# Patient Record
Sex: Female | Born: 1995 | Race: White | Hispanic: No | Marital: Single | Smoking: Never smoker
Health system: Southern US, Community
[De-identification: ages and names within clinical notes are randomized; demographics above are authoritative.]

## PROBLEM LIST (undated history)

## (undated) DIAGNOSIS — A749 Chlamydial infection, unspecified: Secondary | ICD-10-CM

## (undated) HISTORY — DX: Chlamydial infection, unspecified: A74.9

## (undated) HISTORY — PX: NO PAST SURGERIES: SHX2092

---
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
## 2018-02-22 IMAGING — US US ABDOMEN COMPLETE
1 series · 13 of 25 positions shown · non-contrast
Comparison: Ob ultrasound 05/01/2016

CLINICAL DATA: 20-year-old female with fever is eleven days
postpartum.

EXAM:
ABDOMEN ULTRASOUND COMPLETE

[Series 1: us abdomen complete · 0.27mm/px · 13 of 104 slices shown]
[im 1/104]
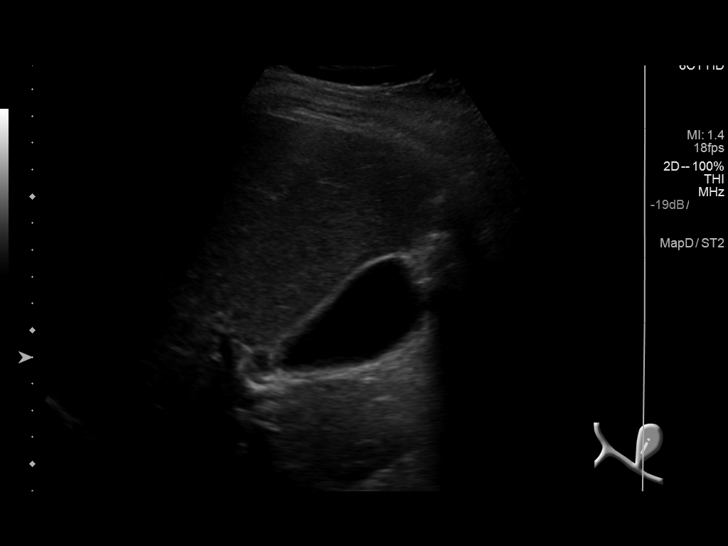
[im 9/104]
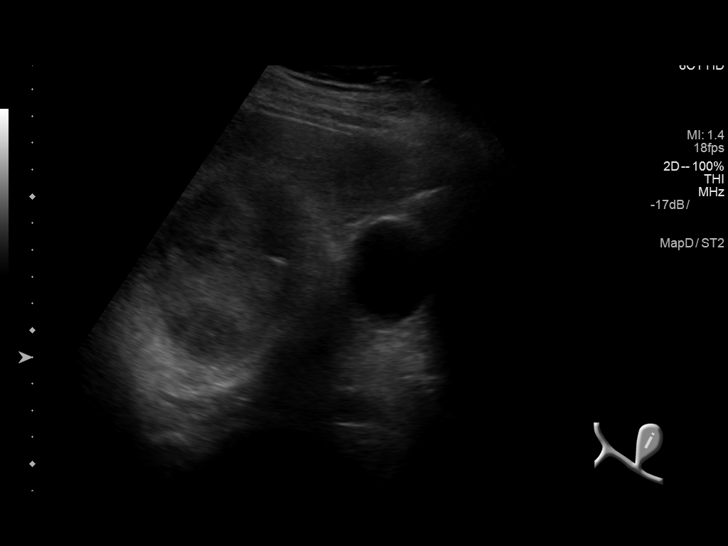
[im 18/104]
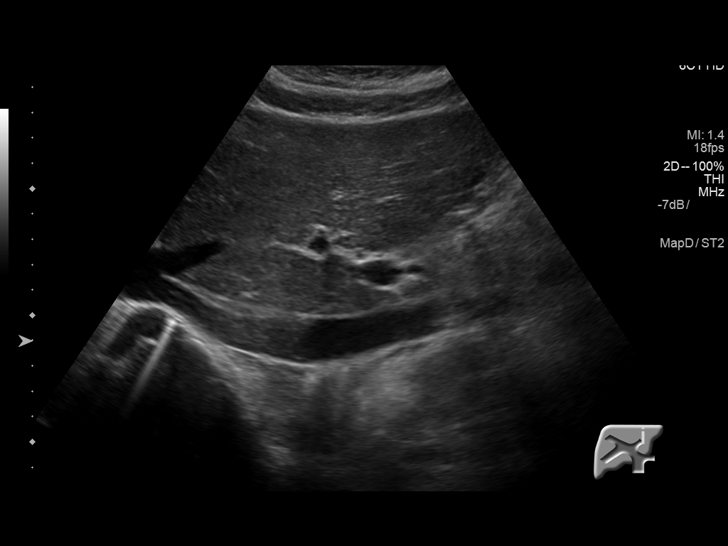
[im 26/104]
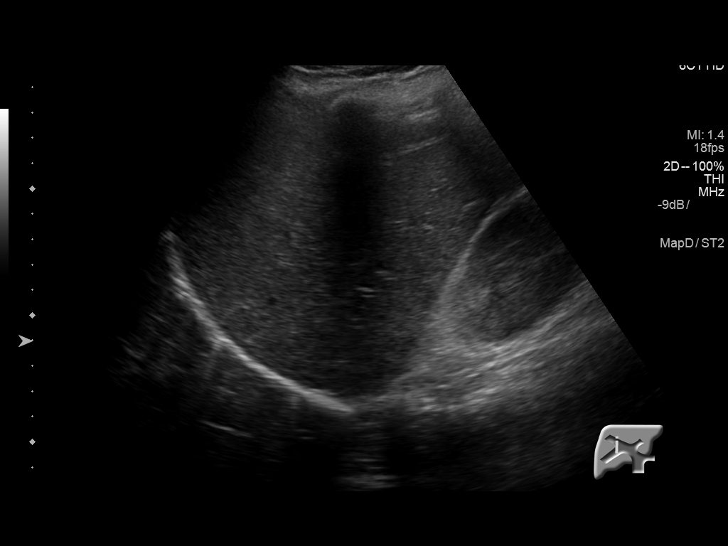
[im 35/104]
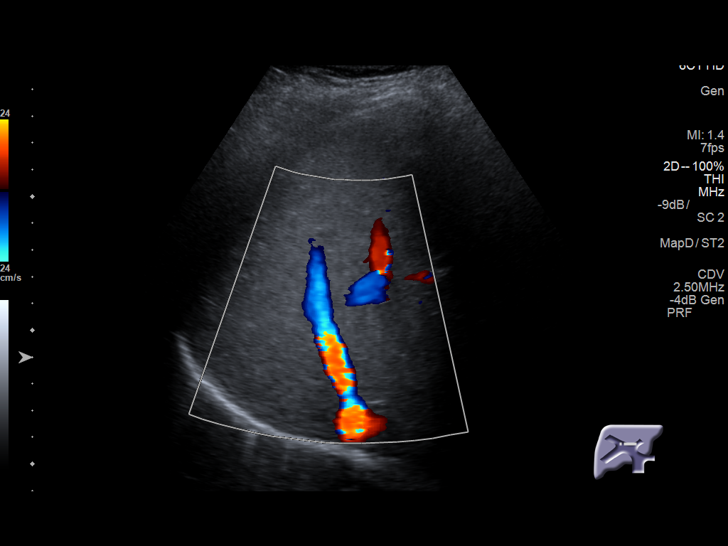
[im 43/104]
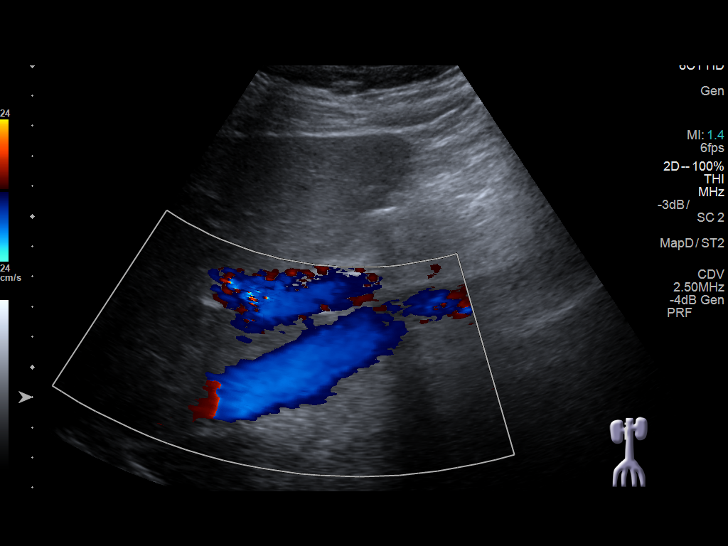
[im 52/104]
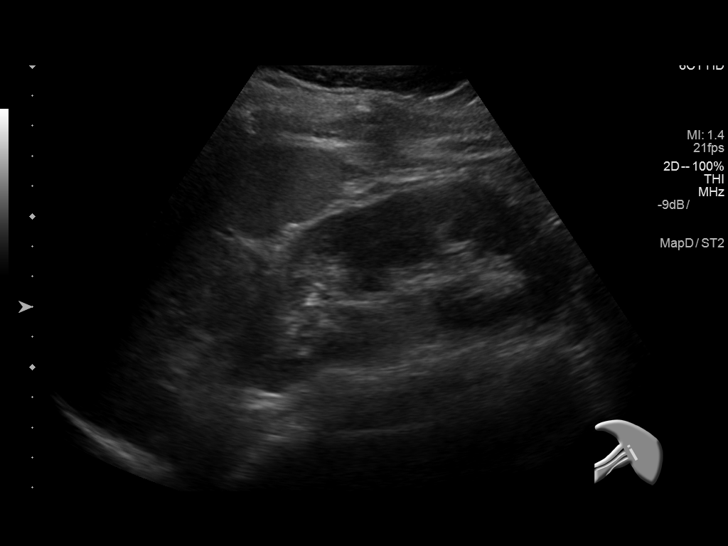
[im 61/104]
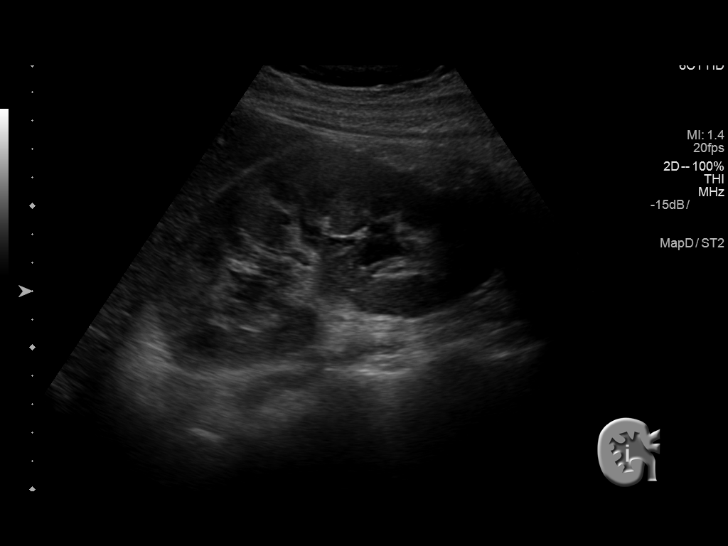
[im 69/104]
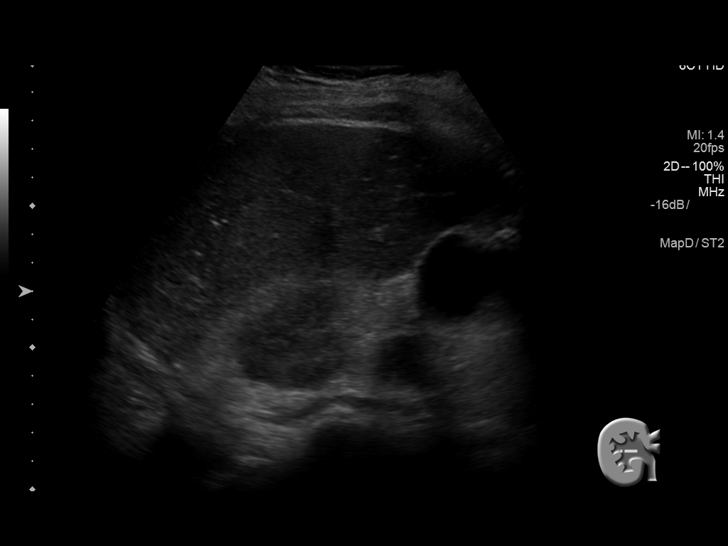
[im 78/104]
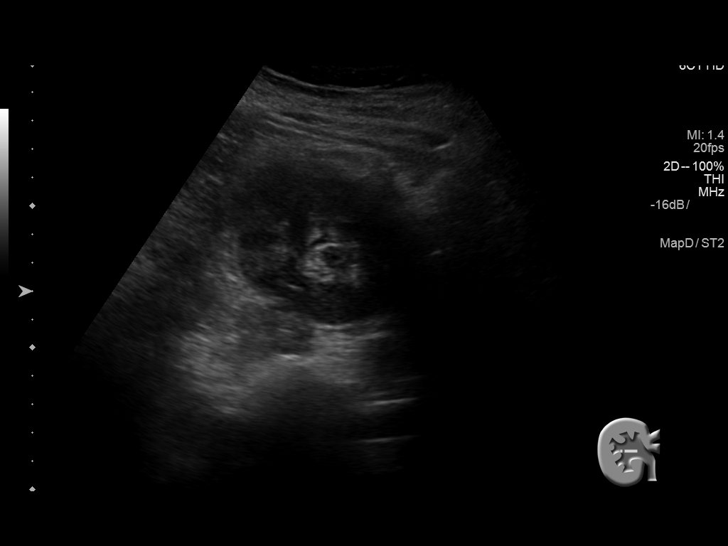
[im 86/104]
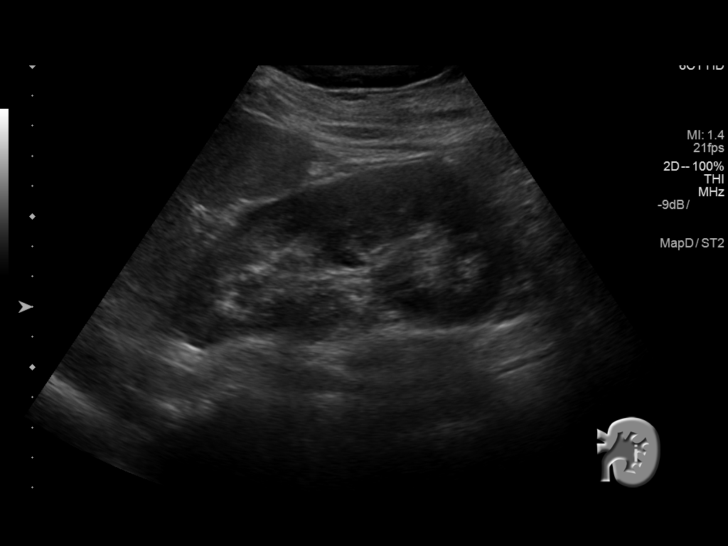
[im 95/104]
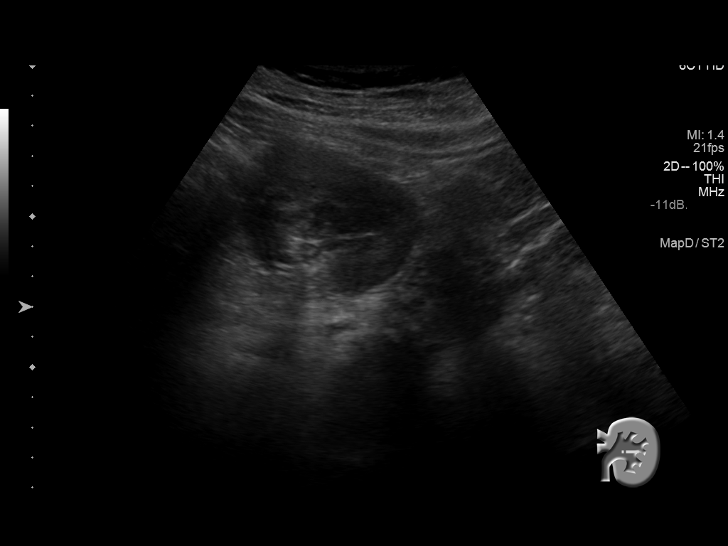
[im 104/104]
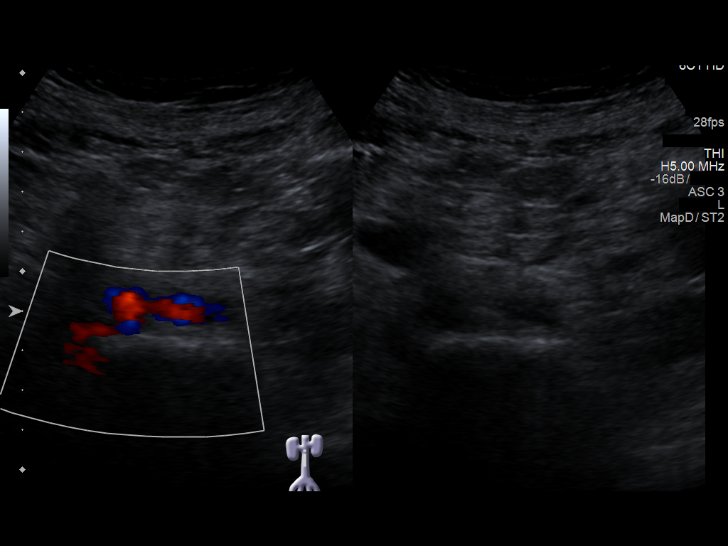

[13 of 25 positions shown; findings below may reference images not displayed]

FINDINGS: Gallbladder: No gallstones or wall thickening visualized. No
sonographic Murphy sign noted by sonographer.

Common bile duct: Diameter: 3 mm, normal

Liver: No focal lesion identified. Within normal limits in
parenchymal echogenicity.

IVC: No abnormality visualized.

Pancreas: Visualized portion unremarkable.

Spleen: Size and appearance within normal limits.

Right Kidney: Length: 13.0 cm. Corticomedullary differentiation is
preserved. No hydronephrosis or discrete right renal lesion.

Left Kidney: Length: 12.1 cm. Corticomedullary differentiation is
preserved. No hydronephrosis or discrete left renal lesion.

Abdominal aorta: No aneurysm visualized.

Other findings: No free fluid.
IMPRESSION: 1. Sonographic appearance of both kidneys is within normal limits;
acute pyelonephritis is not excluded by ultrasound (and Abdomen CT
with IV contrast is necessary to diagnosed pyelonephritis by
imaging) but there is no renal obstruction or renal abscess.
2. Otherwise normal ultrasound appearance of the abdomen.

## 2018-02-23 IMAGING — US US TRANSVAGINAL NON-OB
1 series · 13 of 25 positions shown · non-contrast
Comparison: No nongravid images in PACs

ADDENDUM:
Both transabdominal and transvaginal images were performed and
reviewed.
CLINICAL DATA: Eleven days postpartum, fever, possible urinary
tract infection, evaluate for possible retained products of
conception.

EXAM:
TRANSABDOMINAL ULTRASOUND OF PELVIS
TECHNIQUE: Transabdominal ultrasound examination of the pelvis was performed
including evaluation of the uterus, ovaries, adnexal regions, and
pelvic cul-de-sac.

[Series 1: us transvaginal non-ob · 0.24mm/px · 13 of 85 slices shown]
[im 1/85]
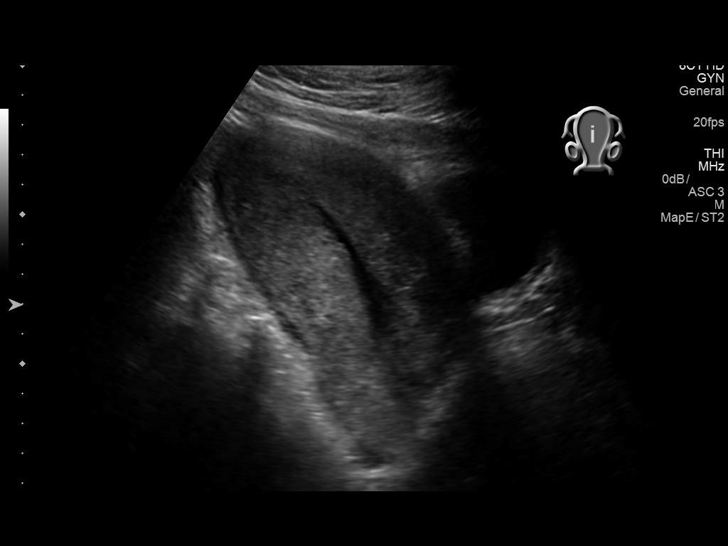
[im 8/85]
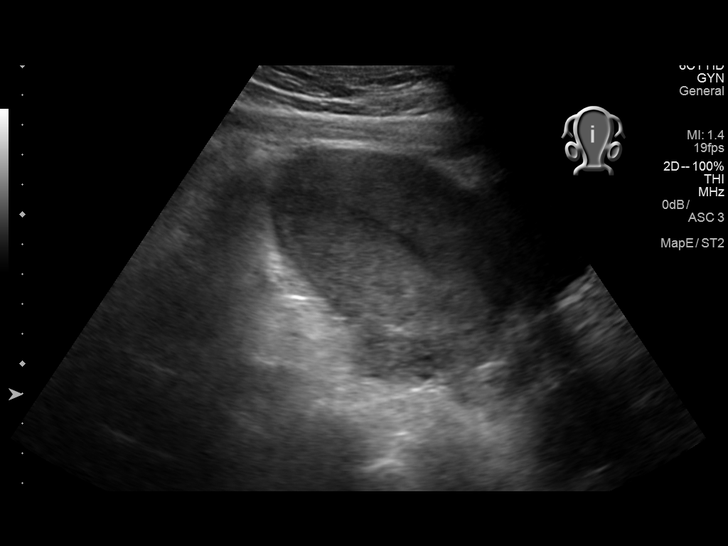
[im 15/85]
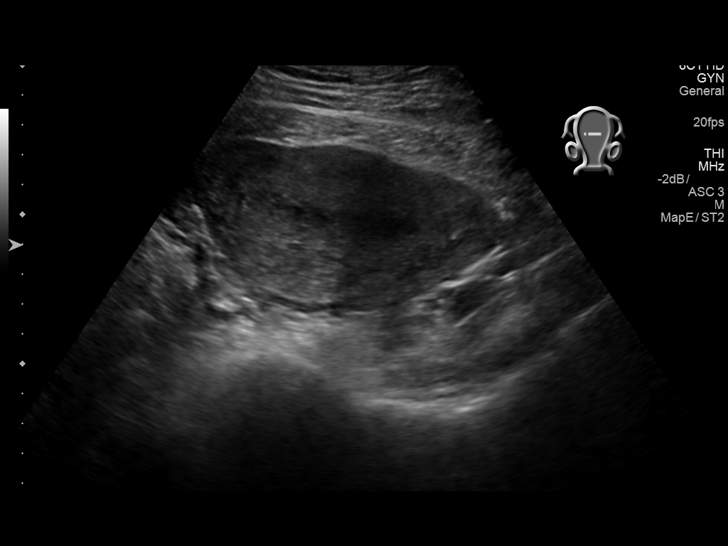
[im 22/85]
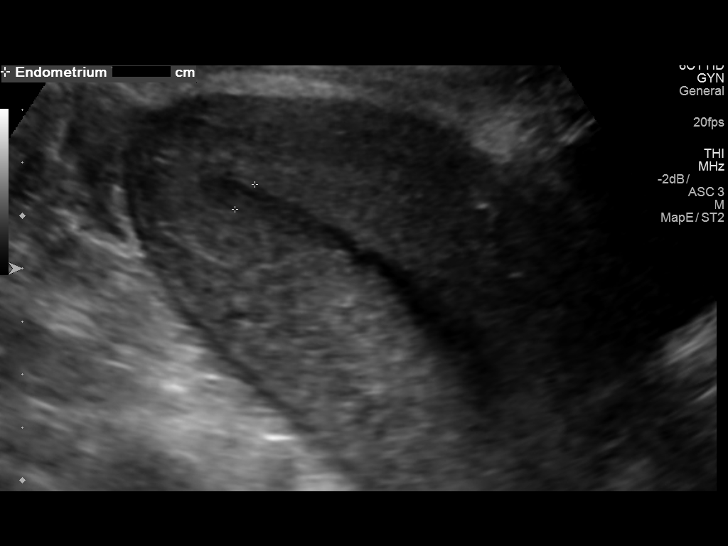
[im 29/85]
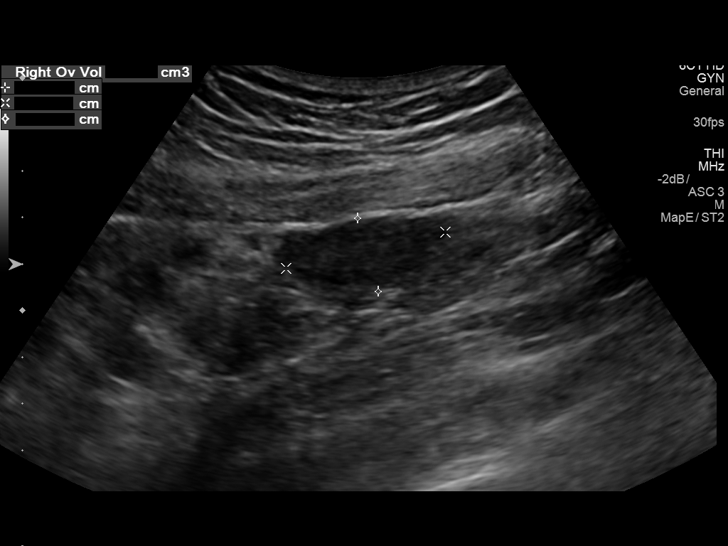
[im 36/85]
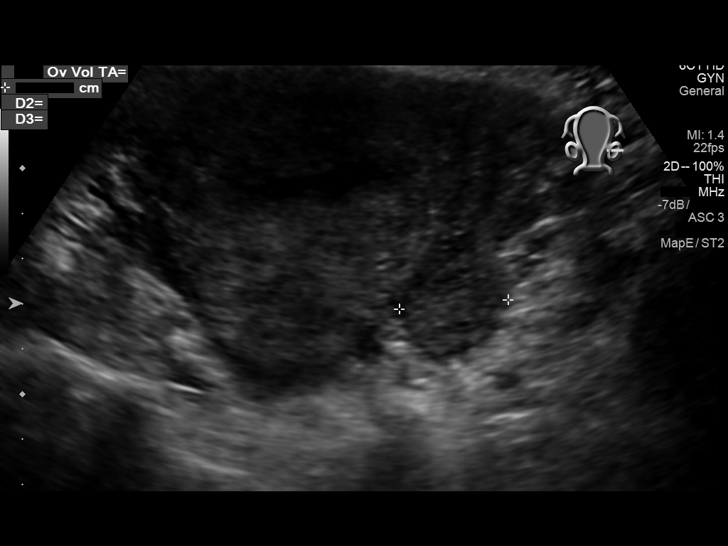
[im 43/85]
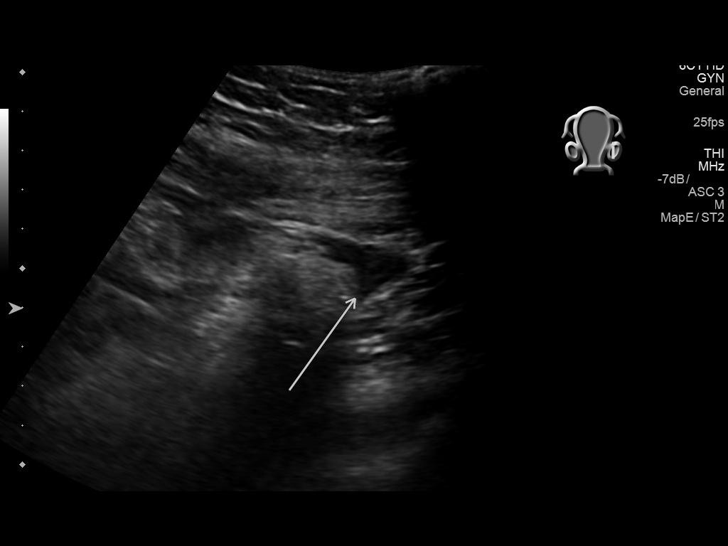
[im 50/85]
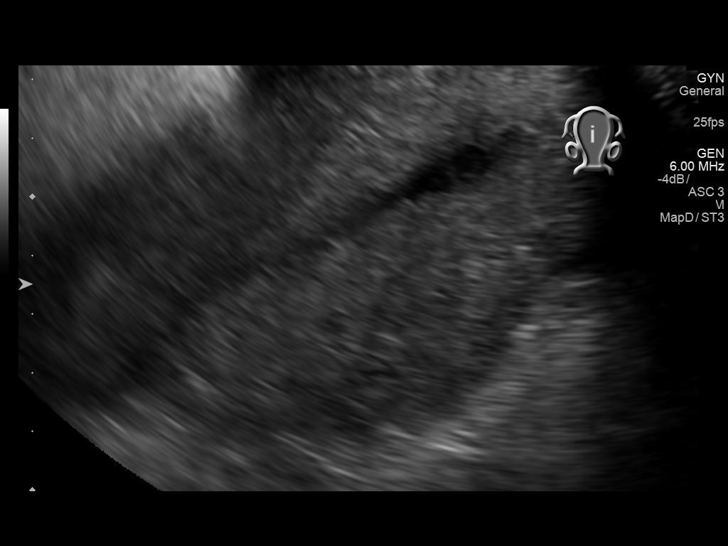
[im 57/85]
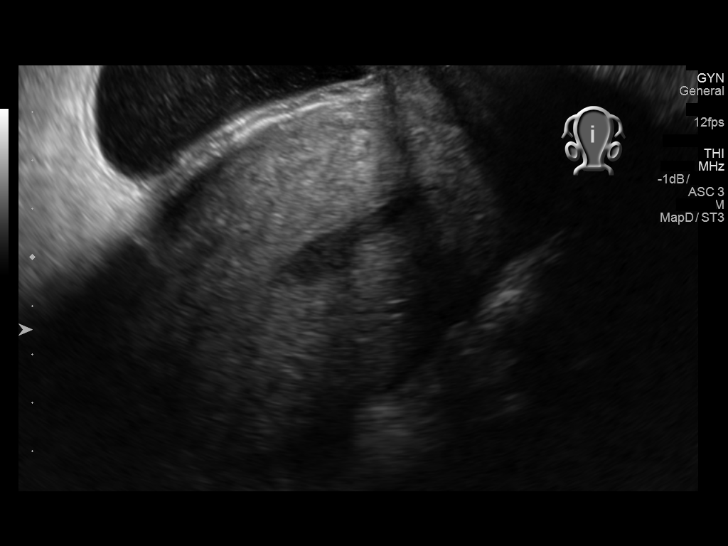
[im 64/85]
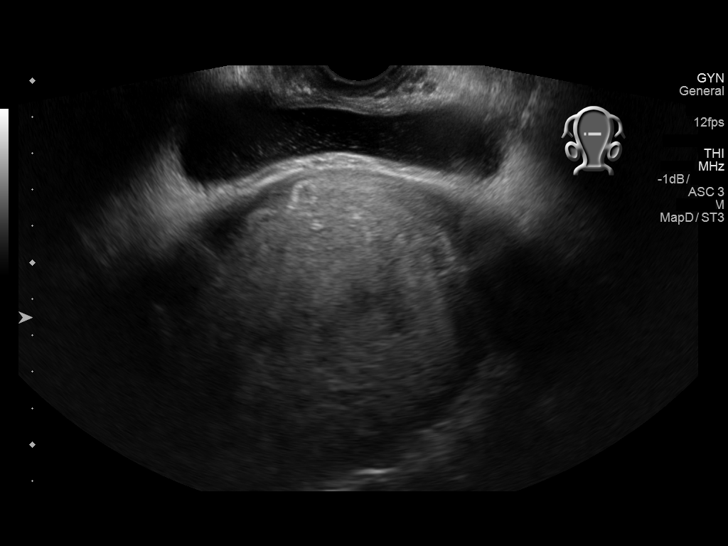
[im 71/85]
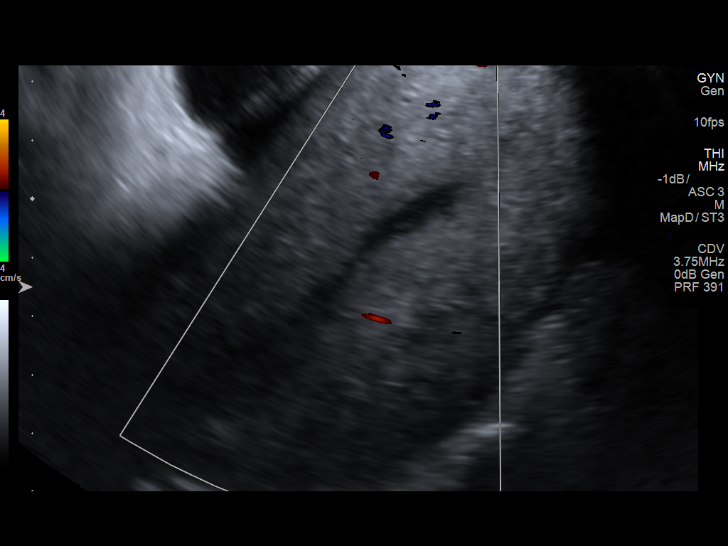
[im 78/85]
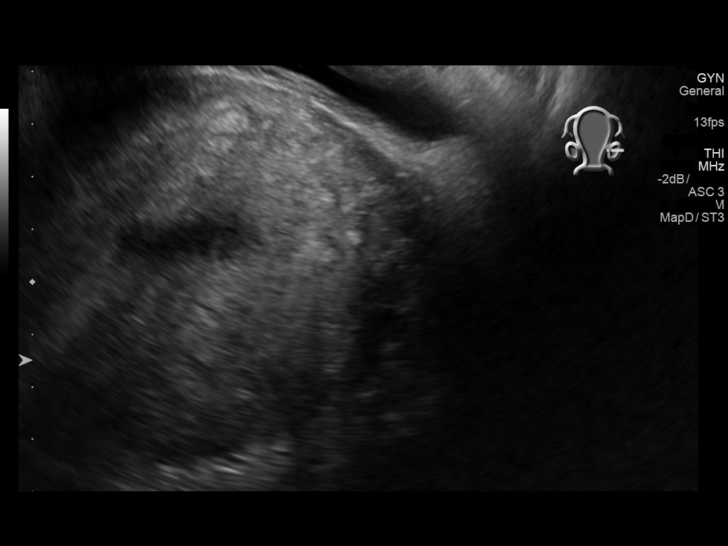
[im 85/85]
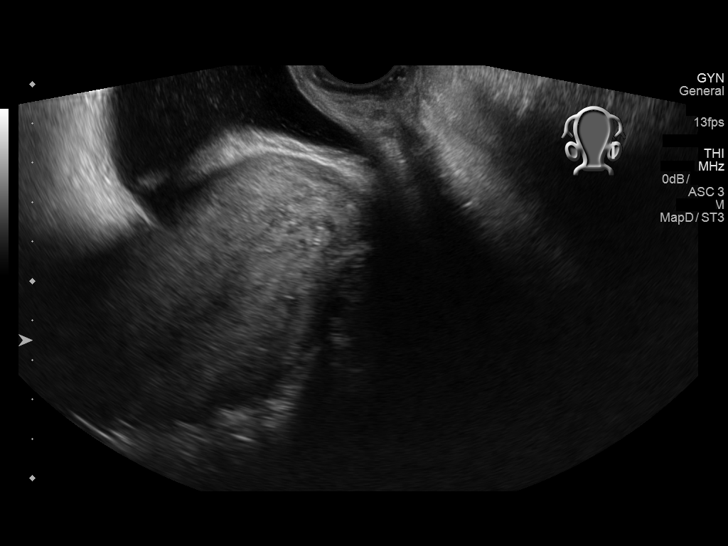

[13 of 25 positions shown; findings below may reference images not displayed]

FINDINGS: Uterus

Measurements: 11.8 x 5.7 x 7.7 cm. No fibroids or other mass
visualized.

Endometrium

Thickness: 6 mm. There is echogenic material within a small amount
of fluid in the mid and lower portions of the endometrial canal
which may reflect retained products of conception.

Right ovary

Measurements: 3.5 x 1.6 x 2.5 cm. Normal appearance/no adnexal mass.

Left ovary

Measurements: 3.6 x 2.6 x 2.4 cm. Normal appearance/no adnexal mass.

Other findings: There is free pelvic fluid posterior to the uterus
and in the adnexal region.
IMPRESSION: There is fluid containing echogenic material in the endometrial
cavity in the mid and lower uterine segment. The endometrial stripe
is not abnormally thickened. The findings may reflect retained
products of conception.

Small amount of free pelvic fluid.

Normal appearance of both ovaries.

## 2018-03-19 IMAGING — DX DG CHEST 2V
2 series · 2 of 2 positions shown · non-contrast
Comparison: 02/03/2014

CLINICAL DATA: Fever, chills, vomiting and cough

EXAM:
CHEST  2 VIEW

[chest pa]
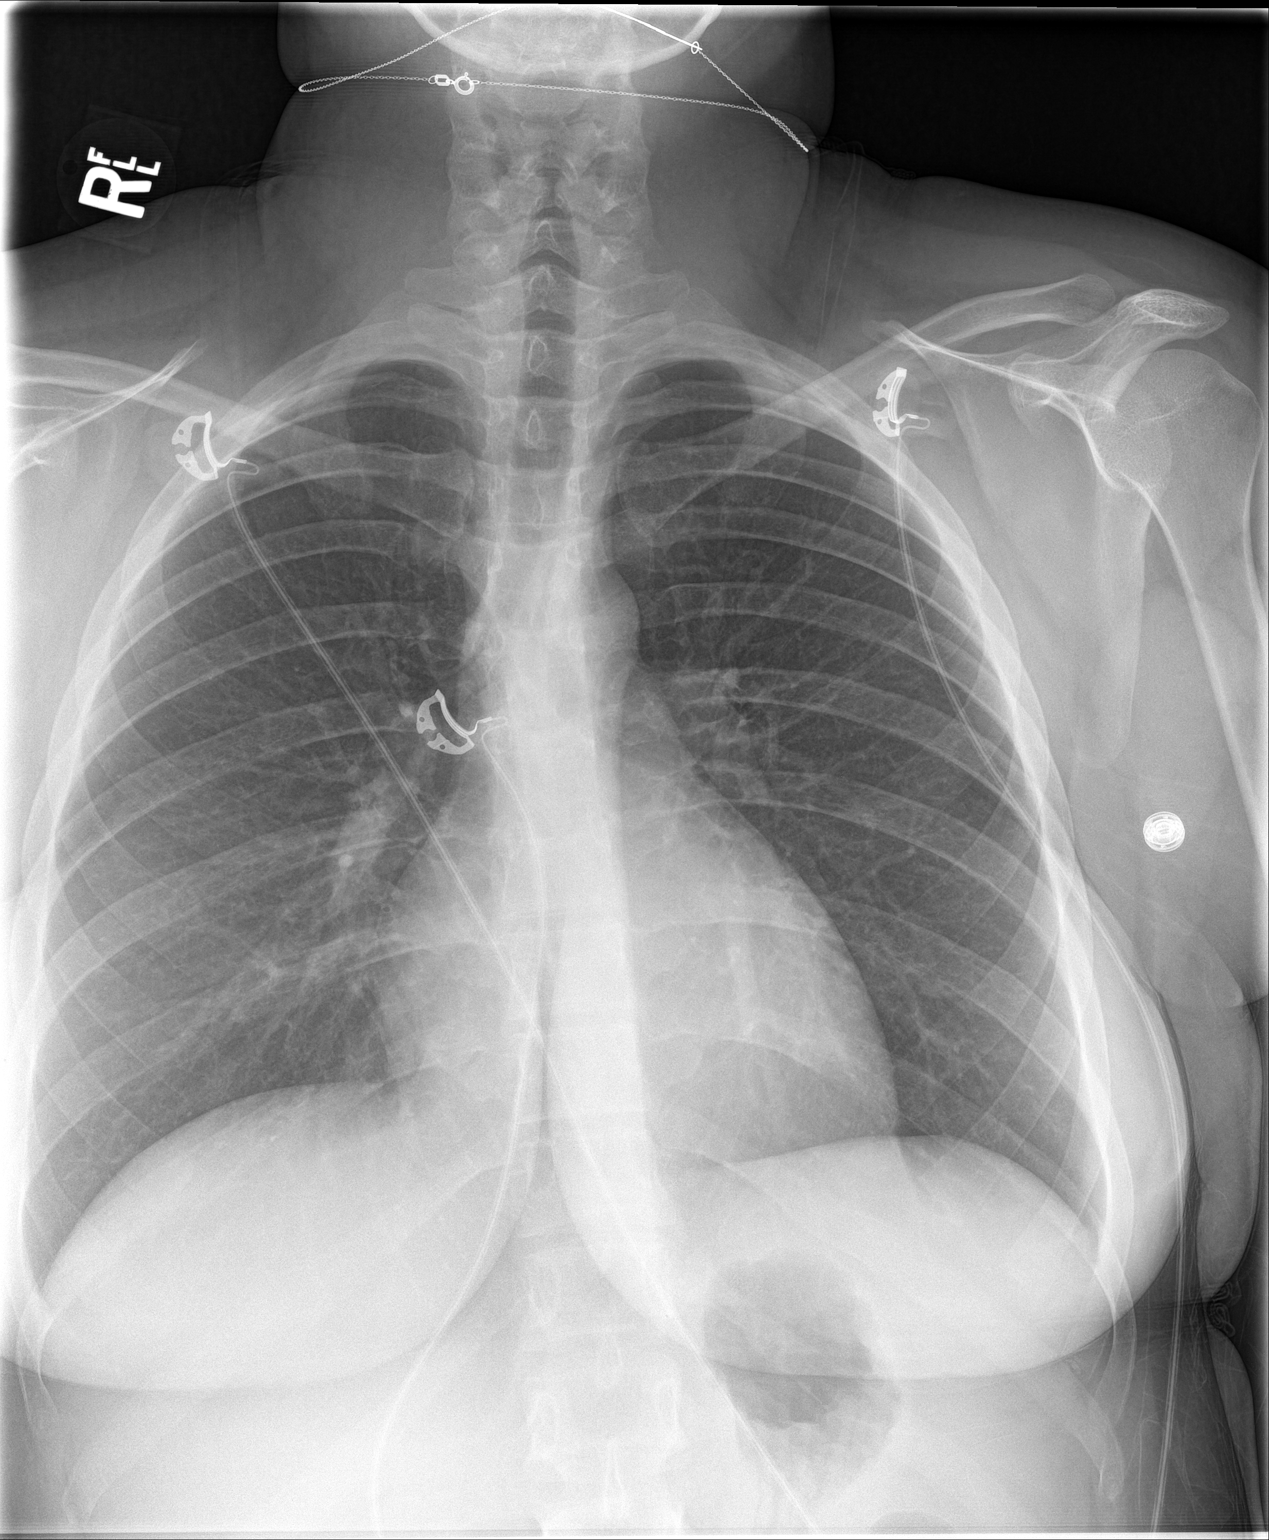

[chest lat]
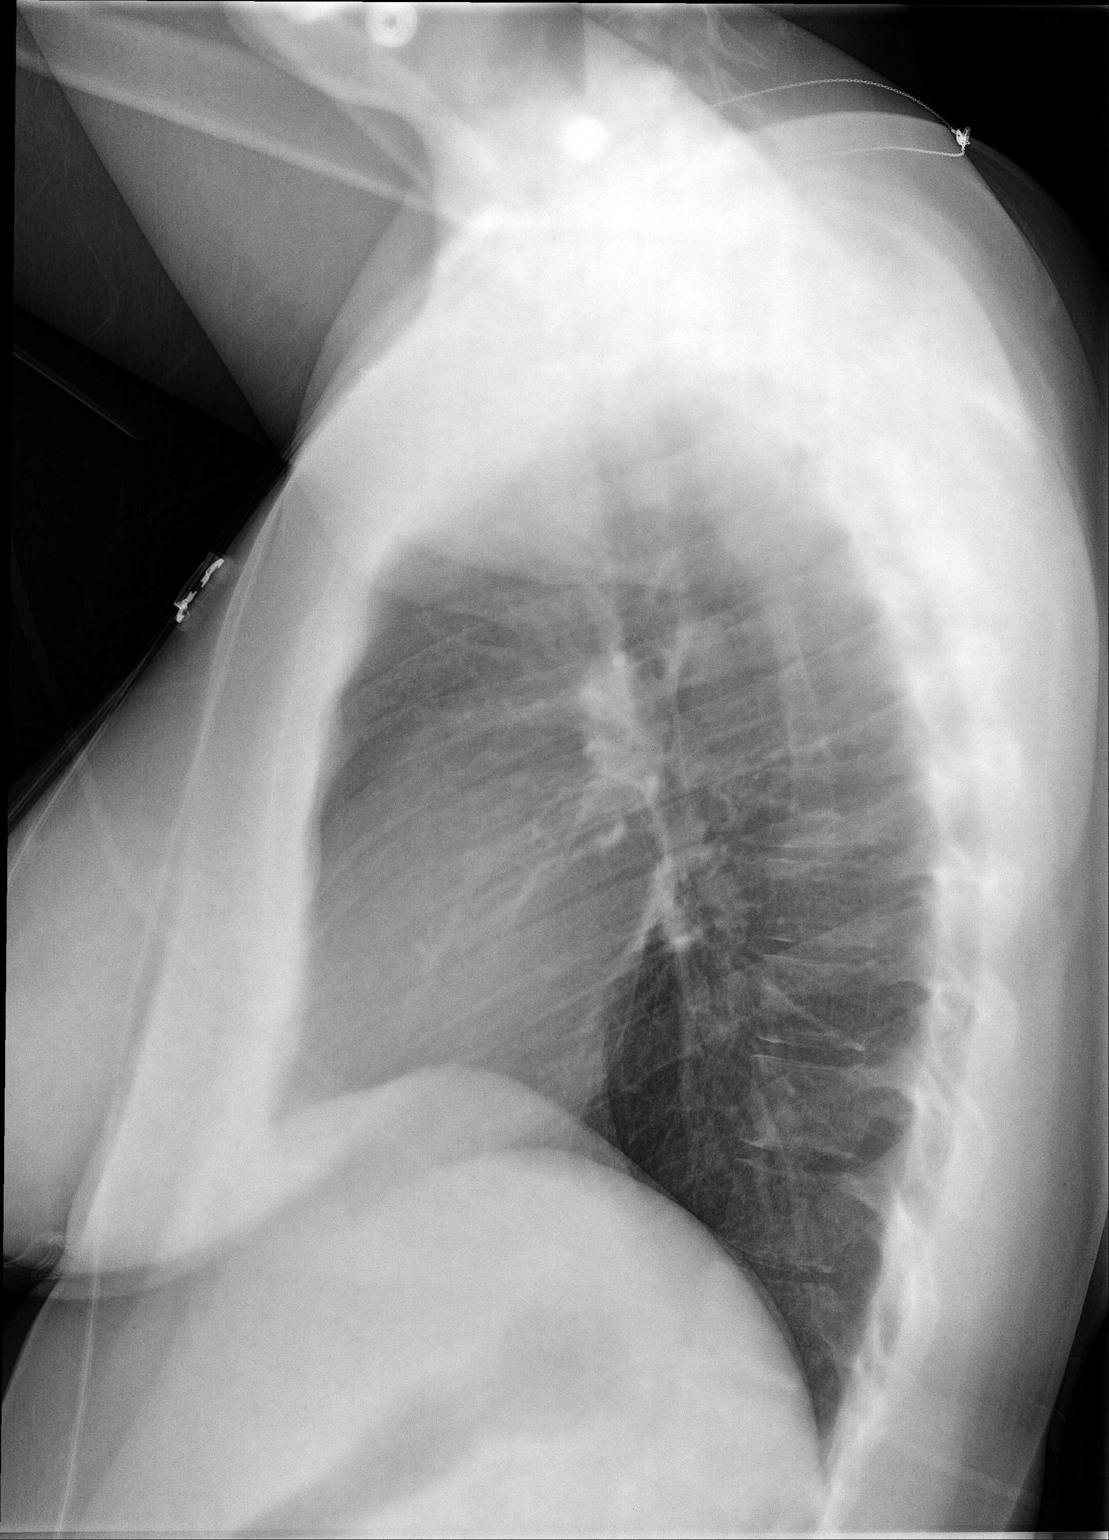

[2 of 2 positions shown; findings below may reference images not displayed]

FINDINGS: The heart size and mediastinal contours are within normal limits.
Both lungs are clear. The visualized skeletal structures are
unremarkable.
IMPRESSION: No active cardiopulmonary disease.
